# Patient Record
Sex: Female | Born: 2000 | Race: Asian | Hispanic: No | Marital: Single | Smoking: Never smoker
Health system: Southern US, Community
[De-identification: ages and names within clinical notes are randomized; demographics above are authoritative.]

## PROBLEM LIST (undated history)

## (undated) DIAGNOSIS — A039 Shigellosis, unspecified: Secondary | ICD-10-CM

## (undated) HISTORY — DX: Shigellosis, unspecified: A03.9

---
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
## 2022-06-12 ENCOUNTER — Encounter (HOSPITAL_COMMUNITY): Payer: Self-pay | Admitting: *Deleted

## 2022-06-12 ENCOUNTER — Ambulatory Visit (HOSPITAL_COMMUNITY)
Admission: EM | Admit: 2022-06-12 | Discharge: 2022-06-12 | Disposition: A | Discharge status: Home / Self Care | Payer: Medicaid Other | Attending: Internal Medicine | Admitting: Internal Medicine

## 2022-06-12 DIAGNOSIS — R0789 Other chest pain: Secondary | ICD-10-CM

## 2022-06-12 NOTE — ED Triage Notes (Signed)
Pt states she woke up this morning with chest pain that is sharp and when she uses her right arm it makes it worse. She said movement and breathing makes it hurt worse. She said she has had this before but she doesn't remember seeing anyone for it.

## 2022-06-12 NOTE — ED Provider Notes (Signed)
[facility]    CSN: [id number] Arrival date & time: 06/12/22  1349      History   Chief Complaint Chief Complaint  Patient presents with   Chest Pain    HPI Karuna Balducci is a 21 y.o. female.   The history is provided by the patient. A language interpreter was used.  Chest Pain Pain location:  L chest and R chest Pain quality: aching   Pain radiates to:  Does not radiate Pain severity:  Moderate Onset quality:  Gradual Timing:  Constant Progression:  Worsening Chronicity:  New Relieved by:  Nothing Worsened by:  Nothing Ineffective treatments:  None tried Associated symptoms: no fever and no headache   Risk factors: no smoking     Past Medical History:  Diagnosis Date   Shigella enteritis     Patient Active Problem List   Diagnosis Date Noted   Microcytic anemia 06/28/2018   Shigella enteritis     History reviewed. No pertinent surgical history.  OB History   No obstetric history on file.      Home Medications    Prior to Admission medications   Medication Sig Start Date End Date Taking? Authorizing Provider  acetaminophen (TYLENOL) 325 MG tablet Take 2 tablets (650 mg total) by mouth every 6 (six) hours as needed for mild pain or moderate pain (mild pain, fever >100.4). 06/28/18   Samule Ohm I, MD  azithromycin (ZITHROMAX) 250 MG tablet Continue to take daily until 11/21. Patient not taking: Reported on 05/28/2021 06/28/18   Samule Ohm I, MD  clotrimazole (GYNE-LOTRIMIN) 1 % vaginal cream May use topically once a day for areas of itching Patient not taking: Reported on 05/28/2021 06/01/20   Augusto Gamble B, NP  fluconazole (DIFLUCAN) 150 MG tablet Take 1 tab today. May repeat in 72 hours if symptoms have not completely resolved. Patient not taking: Reported on 05/28/2021 06/01/20   Zigmund Gottron, NP    Family History Family History  Problem Relation Age of Onset   Healthy Mother    Healthy Father     Social History Social  History   Tobacco Use   Smoking status: Never   Smokeless tobacco: Never  Vaping Use   Vaping Use: Never used  Substance Use Topics   Alcohol use: Never   Drug use: Never     Allergies   Patient has no known allergies.   Review of Systems Review of Systems  Constitutional:  Negative for fever.  Cardiovascular:  Positive for chest pain.  Neurological:  Negative for headaches.  All other systems reviewed and are negative.    Physical Exam Triage Vital Signs ED Triage Vitals  Enc Vitals Group     BP 06/12/22 1400 114/78     Pulse Rate 06/12/22 1400 81     Resp 06/12/22 1400 18     Temp 06/12/22 1400 98.5 F (36.9 C)     Temp Source 06/12/22 1400 Oral     SpO2 06/12/22 1400 99 %     Weight --      Height --      Head Circumference --      Peak Flow --      Pain Score 06/12/22 1359 4     Pain Loc --      Pain Edu? --      Excl. in [place]? --    No data found.  Updated Vital Signs BP 114/78 (BP Location: Left Arm)   Pulse 81  Temp 98.5 F (36.9 C) (Oral)   Resp 18   LMP 05/15/2022 (Exact Date)   SpO2 99%   Visual Acuity Right Eye Distance:   Left Eye Distance:   Bilateral Distance:    Right Eye Near:   Left Eye Near:    Bilateral Near:     Physical Exam Vitals and nursing note reviewed.  Constitutional:      Appearance: She is well-developed.  HENT:     Head: Normocephalic.  Cardiovascular:     Rate and Rhythm: Normal rate and regular rhythm.     Heart sounds: Normal heart sounds.  Pulmonary:     Effort: Pulmonary effort is normal.  Abdominal:     General: There is no distension.  Musculoskeletal:        General: Normal range of motion.     Cervical back: Normal range of motion.  Neurological:     General: No focal deficit present.     Mental Status: She is alert and oriented to person, place, and time.      UC Treatments / Results  Labs (all labs ordered are listed, but only abnormal results are displayed) Labs Reviewed - No data to  display  EKG   Radiology No results found.  Procedures Procedures (including critical care time)  Medications Ordered in UC Medications - No data to display  Initial Impression / Assessment and Plan / UC Course  I have reviewed the triage vital signs and the nursing notes.  Pertinent labs & imaging results that were available during my care of the patient were reviewed by me and considered in my medical decision making (see chart for details).     MDM:  Pt has pain to palpation,  Lungs and heart sound normal  vital signs are normal.  Pt advised ibuprofen.  Return if any problems.  Final Clinical Impressions(s) / UC Diagnoses   Final diagnoses:  Chest wall pain     Discharge Instructions      Ibuprofen for discomfort.  Return for recheck if symptoms persist.    ED Prescriptions   None    An After Visit Summary was printed and given to the patient.  PDMP not reviewed this encounter.   Elson Areas, [place] 06/12/22 1449

## 2022-06-12 NOTE — Discharge Instructions (Signed)
Ibuprofen for discomfort.  Return for recheck if symptoms persist.

## 2022-08-08 DIAGNOSIS — S71152A Open bite, left thigh, initial encounter: Secondary | ICD-10-CM | POA: Diagnosis not present

## 2023-03-23 ENCOUNTER — Ambulatory Visit: Payer: Medicaid Other

## 2023-04-01 ENCOUNTER — Encounter (HOSPITAL_COMMUNITY): Payer: Self-pay

## 2023-04-01 ENCOUNTER — Ambulatory Visit (HOSPITAL_COMMUNITY)
Admission: EM | Admit: 2023-04-01 | Discharge: 2023-04-01 | Disposition: A | Discharge status: Home / Self Care | Payer: Medicaid Other | Attending: Internal Medicine | Admitting: Internal Medicine

## 2023-04-01 DIAGNOSIS — N3001 Acute cystitis with hematuria: Secondary | ICD-10-CM | POA: Diagnosis not present

## 2023-04-01 LAB — POCT URINALYSIS DIP (MANUAL ENTRY)
Bilirubin, UA: NEGATIVE
Glucose, UA: NEGATIVE mg/dL
Ketones, POC UA: NEGATIVE mg/dL
Nitrite, UA: NEGATIVE
Protein Ur, POC: 100 mg/dL — AB
Spec Grav, UA: >=1.03 — AB (ref 1.010–1.025)
Urobilinogen, UA: 0.2 E.U./dL
pH, UA: 5.5 (ref 5.0–8.0)

## 2023-04-01 MED ORDER — NITROFURANTOIN MONOHYD MACRO 100 MG PO CAPS: 100.0000 mg | ORAL_CAPSULE | Freq: Two times a day (BID) | ORAL | 0 refills | Status: AC

## 2023-04-01 MED ORDER — PHENAZOPYRIDINE HCL 100 MG PO TABS: 100.0000 mg | ORAL_TABLET | Freq: Three times a day (TID) | ORAL | 0 refills | Status: DC | PRN

## 2023-04-01 NOTE — ED Triage Notes (Signed)
Pt c/o burning on urination, urgency and frequency, and bladder pressure since this am. States having sensation to urinate but can't.

## 2023-04-01 NOTE — ED Provider Notes (Signed)
[facility]    CSN: [id number] Arrival date & time: 04/01/23  1309      History   Chief Complaint Chief Complaint  Patient presents with   Urinary Tract Infection    HPI Amber Phillips is a 22 y.o. female.   Patient presents today with 1 day history of dysuria, urinary frequency and urgency, voiding smaller amounts, first noticed hematuria in urgent care today.  She denies foul urinary odor, abdominal pain, suprapubic pain or pressure, flank pain, fever, nausea/vomiting, and vaginal discharge.  She is sexually active, no concern for STIs.  Tries to urinate after sex.  Has not take anything for symptoms so far.    Past Medical History:  Diagnosis Date   Shigella enteritis     Patient Active Problem List   Diagnosis Date Noted   Microcytic anemia 06/28/2018   Shigella enteritis     History reviewed. No pertinent surgical history.  OB History   No obstetric history on file.      Home Medications    Prior to Admission medications   Medication Sig Start Date End Date Taking? Authorizing Provider  acetaminophen (TYLENOL) 325 MG tablet Take 2 tablets (650 mg total) by mouth every 6 (six) hours as needed for mild pain or moderate pain (mild pain, fever >100.4). 06/28/18   Collene Gobble I, MD  nitrofurantoin, macrocrystal-monohydrate, (MACROBID) 100 MG capsule Take 1 capsule (100 mg total) by mouth 2 (two) times daily for 5 days. 04/01/23 04/06/23 Yes Valentino Nose, NP  phenazopyridine (PYRIDIUM) 100 MG tablet Take 1 tablet (100 mg total) by mouth 3 (three) times daily as needed for pain. 04/01/23  Yes Valentino Nose, NP    Family History Family History  Problem Relation Age of Onset   Healthy Mother    Healthy Father     Social History Social History   Tobacco Use   Smoking status: Never   Smokeless tobacco: Never  Vaping Use   Vaping status: Never Used  Substance Use Topics   Alcohol use: Never   Drug use: Never     Allergies   Patient has  no known allergies.   Review of Systems Review of Systems Per HPI  Physical Exam Triage Vital Signs ED Triage Vitals  Encounter Vitals Group     BP 04/01/23 1335 124/83     Systolic BP Percentile --      Diastolic BP Percentile --      Pulse Rate 04/01/23 1335 88     Resp 04/01/23 1335 18     Temp 04/01/23 1335 98.6 F (37 C)     Temp Source 04/01/23 1335 Oral     SpO2 04/01/23 1335 99 %     Weight --      Height --      Head Circumference --      Peak Flow --      Pain Score 04/01/23 1336 6     Pain Loc --      Pain Education --      Exclude from Growth Chart --    No data found.  Updated Vital Signs BP 124/83 (BP Location: Left Arm)   Pulse 88   Temp 98.6 F (37 C) (Oral)   Resp 18   LMP 03/10/2023   SpO2 99%   Visual Acuity Right Eye Distance:   Left Eye Distance:   Bilateral Distance:    Right Eye Near:   Left Eye Near:    Bilateral  Near:     Physical Exam Vitals and nursing note reviewed.  Constitutional:      General: She is not in acute distress.    Appearance: She is not toxic-appearing.  Pulmonary:     Effort: Pulmonary effort is normal. No respiratory distress.  Abdominal:     General: Abdomen is flat. Bowel sounds are normal. There is no distension.     Palpations: Abdomen is soft. There is no mass.     Tenderness: There is no abdominal tenderness. There is no right CVA tenderness, left CVA tenderness or guarding.  Skin:    General: Skin is warm and dry.     Coloration: Skin is not jaundiced or pale.     Findings: No erythema.  Neurological:     Mental Status: She is alert and oriented to person, place, and time.     Motor: No weakness.     Gait: Gait normal.  Psychiatric:        Behavior: Behavior is cooperative.      UC Treatments / Results  Labs (all labs ordered are listed, but only abnormal results are displayed) Labs Reviewed  POCT URINALYSIS DIP (MANUAL ENTRY) - Abnormal; Notable for the following components:      Result  Value   Color, UA red (*)    Clarity, UA hazy (*)    Spec Grav, UA >=1.030 (*)    Blood, UA large (*)    Protein Ur, POC =100 (*)    Leukocytes, UA Small (1+) (*)    All other components within normal limits  URINE CULTURE    EKG   Radiology No results found.  Procedures Procedures (including critical care time)  Medications Ordered in UC Medications - No data to display  Initial Impression / Assessment and Plan / UC Course  I have reviewed the triage vital signs and the nursing notes.  Pertinent labs & imaging results that were available during my care of the patient were reviewed by me and considered in my medical decision making (see chart for details).   Patient is well-appearing, normotensive, afebrile, not tachycardic, not tachypneic, oxygenating well on room air.    1. Acute cystitis with hematuria Urinalysis today is hazy with elevated specific gravity, large amount of blood, small leukocyte esterase Urine culture is pending Will treat for UTI with Macrobid twice daily for 5 days Start Pyridium for bladder analgesia Strict ER and return precautions discussed with patient  The patient was given the opportunity to ask questions.  All questions answered to their satisfaction.  The patient is in agreement to this plan.    Final Clinical Impressions(s) / UC Diagnoses   Final diagnoses:  Acute cystitis with hematuria     Discharge Instructions      The urine sample today suggests UTI.  Take the Macrobid twice daily for 5 days as prescribed to treat it.  You can take the Pyridium every 8 hours as needed for bladder pain.  This will dye urine bright orange, so do not be alarmed.  We will contact you later this week if the urine culture shows that we need to change the antibiotic.  Please increase water intake.  If you develop fever, nausea/vomiting and are unable to keep fluids down, please go to the ER.     ED Prescriptions     Medication Sig Dispense Auth.  Provider   nitrofurantoin, macrocrystal-monohydrate, (MACROBID) 100 MG capsule Take 1 capsule (100 mg total) by mouth 2 (two) times  daily for 5 days. 10 capsule Cathlean Marseilles A, NP   phenazopyridine (PYRIDIUM) 100 MG tablet Take 1 tablet (100 mg total) by mouth 3 (three) times daily as needed for pain. 12 tablet Valentino Nose, NP      PDMP not reviewed this encounter.   Valentino Nose, NP 04/01/23 1432

## 2023-04-01 NOTE — Discharge Instructions (Signed)
The urine sample today suggests UTI.  Take the Macrobid twice daily for 5 days as prescribed to treat it.  You can take the Pyridium every 8 hours as needed for bladder pain.  This will dye urine bright orange, so do not be alarmed.  We will contact you later this week if the urine culture shows that we need to change the antibiotic.  Please increase water intake.  If you develop fever, nausea/vomiting and are unable to keep fluids down, please go to the ER.

## 2023-04-03 LAB — URINE CULTURE: Culture: 80000 — AB

## 2023-05-01 ENCOUNTER — Ambulatory Visit
Admission: EM | Admit: 2023-05-01 | Discharge: 2023-05-01 | Disposition: A | Discharge status: Home / Self Care | Payer: Medicaid Other | Attending: Internal Medicine | Admitting: Internal Medicine

## 2023-05-01 DIAGNOSIS — R350 Frequency of micturition: Secondary | ICD-10-CM | POA: Insufficient documentation

## 2023-05-01 DIAGNOSIS — N3001 Acute cystitis with hematuria: Secondary | ICD-10-CM | POA: Insufficient documentation

## 2023-05-01 DIAGNOSIS — R300 Dysuria: Secondary | ICD-10-CM | POA: Diagnosis not present

## 2023-05-01 LAB — POCT URINALYSIS DIP (MANUAL ENTRY)
Bilirubin, UA: NEGATIVE
Glucose, UA: NEGATIVE mg/dL
Ketones, POC UA: NEGATIVE mg/dL
Nitrite, UA: NEGATIVE
Protein Ur, POC: NEGATIVE mg/dL
Spec Grav, UA: 1.02 (ref 1.010–1.025)
Urobilinogen, UA: 0.2 E.U./dL
pH, UA: 7 (ref 5.0–8.0)

## 2023-05-01 MED ORDER — CIPROFLOXACIN HCL 500 MG PO TABS: 500.0000 mg | ORAL_TABLET | Freq: Two times a day (BID) | ORAL | 0 refills | Status: DC

## 2023-05-01 NOTE — ED Provider Notes (Signed)
[facility]    CSN: [id number] Arrival date & time: 05/01/23  1102      History   Chief Complaint Chief Complaint  Patient presents with   Dysuria    HPI Amber Phillips is a 22 y.o. female.   Patient presents with a 2 to 3-day history of urinary burning, urinary frequency, bladder pressure.  Patient had a UTI approximately 1 month ago and was treated with success.  States that she does not typically get UTIs this often but has had them in the past.  Last menstrual cycle was 04/01/2023.  Patient is not reporting any fever, back pain, abdominal pain, vaginal discharge.   Dysuria   Past Medical History:  Diagnosis Date   Shigella enteritis     Patient Active Problem List   Diagnosis Date Noted   Microcytic anemia 06/28/2018   Shigella enteritis     History reviewed. No pertinent surgical history.  OB History   No obstetric history on file.      Home Medications    Prior to Admission medications   Medication Sig Start Date End Date Taking? Authorizing Provider  ciprofloxacin (CIPRO) 500 MG tablet Take 1 tablet (500 mg total) by mouth every 12 (twelve) hours. 05/01/23  Yes Anilah Huck, Acie Fredrickson, FNP  acetaminophen (TYLENOL) 325 MG tablet Take 2 tablets (650 mg total) by mouth every 6 (six) hours as needed for mild pain or moderate pain (mild pain, fever >100.4). 06/28/18   Collene Gobble I, MD  phenazopyridine (PYRIDIUM) 100 MG tablet Take 1 tablet (100 mg total) by mouth 3 (three) times daily as needed for pain. 04/01/23   Valentino Nose, NP    Family History Family History  Problem Relation Age of Onset   Healthy Mother    Healthy Father     Social History Social History   Tobacco Use   Smoking status: Never   Smokeless tobacco: Never  Vaping Use   Vaping status: Never Used  Substance Use Topics   Alcohol use: Never   Drug use: Never     Allergies   Patient has no known allergies.   Review of Systems Review of Systems Per HPI  Physical  Exam Triage Vital Signs ED Triage Vitals  Encounter Vitals Group     BP 05/01/23 1214 112/74     Systolic BP Percentile --      Diastolic BP Percentile --      Pulse Rate 05/01/23 1214 83     Resp 05/01/23 1214 16     Temp 05/01/23 1214 97.9 F (36.6 C)     Temp Source 05/01/23 1214 Oral     SpO2 05/01/23 1214 98 %     Weight 05/01/23 1213 140 lb (63.5 kg)     Height 05/01/23 1213 5\' 2"  (1.575 m)     Head Circumference --      Peak Flow --      Pain Score 05/01/23 1213 9     Pain Loc --      Pain Education --      Exclude from Growth Chart --    No data found.  Updated Vital Signs BP 112/74 (BP Location: Left Arm)   Pulse 83   Temp 97.9 F (36.6 C) (Oral)   Resp 16   Ht 5\' 2"  (1.575 m)   Wt 140 lb (63.5 kg)   LMP 04/11/2023 (Exact Date)   SpO2 98%   BMI 25.61 kg/m   Visual Acuity Right Eye  Distance:   Left Eye Distance:   Bilateral Distance:    Right Eye Near:   Left Eye Near:    Bilateral Near:     Physical Exam Constitutional:      General: She is not in acute distress.    Appearance: Normal appearance. She is not toxic-appearing or diaphoretic.  HENT:     Head: Normocephalic and atraumatic.  Eyes:     Extraocular Movements: Extraocular movements intact.     Conjunctiva/sclera: Conjunctivae normal.  Pulmonary:     Effort: Pulmonary effort is normal.  Neurological:     General: No focal deficit present.     Mental Status: She is alert and oriented to person, place, and time. Mental status is at baseline.  Psychiatric:        Mood and Affect: Mood normal.        Behavior: Behavior normal.        Thought Content: Thought content normal.        Judgment: Judgment normal.      UC Treatments / Results  Labs (all labs ordered are listed, but only abnormal results are displayed) Labs Reviewed  POCT URINALYSIS DIP (MANUAL ENTRY) - Abnormal; Notable for the following components:      Result Value   Clarity, UA cloudy (*)    Blood, UA trace-lysed (*)     Leukocytes, UA Small (1+) (*)    All other components within normal limits  URINE CULTURE    EKG   Radiology No results found.  Procedures Procedures (including critical care time)  Medications Ordered in UC Medications - No data to display  Initial Impression / Assessment and Plan / UC Course  I have reviewed the triage vital signs and the nursing notes.  Pertinent labs & imaging results that were available during my care of the patient were reviewed by me and considered in my medical decision making (see chart for details).     UA resembling UTI.  Will treat with Cipro. Patient denies that she takes any daily medications or has any chronic health problems so this should be safe.   Urine culture pending. Advised preventative measures for UTI.  Advised strict follow-up precautions.  Patient verbalized understanding and was agreeable with plan. Final Clinical Impressions(s) / UC Diagnoses   Final diagnoses:  Acute cystitis with hematuria  Dysuria  Urinary frequency   Discharge Instructions   None    ED Prescriptions     Medication Sig Dispense Auth. Provider   ciprofloxacin (CIPRO) 500 MG tablet Take 1 tablet (500 mg total) by mouth every 12 (twelve) hours. 10 tablet Gustavus Bryant, [place]      PDMP not reviewed this encounter.   Gustavus Bryant, [place] 05/01/23 [phone]

## 2023-05-01 NOTE — Discharge Instructions (Signed)
I have sent an antibiotic to treat UTI.  Follow-up if any symptoms persist or worsen.

## 2023-05-01 NOTE — ED Triage Notes (Signed)
Patient here today with c/o burning in urination, frequency, and pressure X 2-3 days. Patient was here on 04/01/2023 with same issue. Patient states that she completed all the medication and the symptoms went away but the came back.

## 2023-05-03 LAB — URINE CULTURE: Culture: >=100000 — AB

## 2024-05-04 ENCOUNTER — Encounter: Payer: Self-pay | Admitting: Emergency Medicine

## 2024-05-04 ENCOUNTER — Ambulatory Visit
Admission: EM | Admit: 2024-05-04 | Discharge: 2024-05-04 | Disposition: A | Discharge status: Home / Self Care | Attending: Nurse Practitioner | Admitting: Nurse Practitioner

## 2024-05-04 DIAGNOSIS — N760 Acute vaginitis: Secondary | ICD-10-CM | POA: Insufficient documentation

## 2024-05-04 LAB — POCT URINE PREGNANCY: Preg Test, Ur: NEGATIVE

## 2024-05-04 MED ORDER — FLUCONAZOLE 150 MG PO TABS: 150.0000 mg | ORAL_TABLET | ORAL | 0 refills | Status: DC

## 2024-05-04 MED ORDER — METRONIDAZOLE 500 MG PO TABS: 500.0000 mg | ORAL_TABLET | Freq: Two times a day (BID) | ORAL | 0 refills | Status: DC

## 2024-05-04 NOTE — ED Provider Notes (Signed)
 [facility]    CSN: [id number] Arrival date & time: 05/04/24  1001      History   Chief Complaint Chief Complaint  Patient presents with   Possible yeast infection     HPI Amber Phillips is a 23 y.o. female.   Discussed the use of AI scribe software for clinical note transcription with the patient, who gave verbal consent to proceed.   The patient is a sexually active female presenting with symptoms of a yeast infection that has been ongoing for more than 4 days. She reports experiencing itching and irritation in the vaginal area, with pain when wiping that is described as very painful. She also notes that the area feels swollen at times. The patient is experiencing vaginal discharge that she describes as clumpy, like cottage cheese, with a slight odor that is not too bad. She denies pain or burning with urination, stating that discomfort only occurs when wiping afterward. Her last menstrual period was August 20-24. She has one female sexual partner in the past 3 months and uses condoms sometimes.   The following sections of the patient's history were reviewed and updated as appropriate: allergies, current medications, past family history, past medical history, past social history, past surgical history, and problem list.      Past Medical History:  Diagnosis Date   Shigella enteritis     Patient Active Problem List   Diagnosis Date Noted   Microcytic anemia 06/28/2018   Shigella enteritis     History reviewed. No pertinent surgical history.  OB History   No obstetric history on file.      Home Medications    Prior to Admission medications   Medication Sig Start Date End Date Taking? Authorizing Provider  fluconazole  (DIFLUCAN ) 150 MG tablet Take 1 tablet (150 mg total) by mouth every 3 (three) days for 2 doses. 05/04/24 05/08/24 Yes Iola Lukes, FNP  metroNIDAZOLE  (FLAGYL ) 500 MG tablet Take 1 tablet (500 mg total) by mouth 2 (two) times daily. 05/04/24   Yes Iola Lukes, FNP    Family History Family History  Problem Relation Age of Onset   Healthy Mother    Healthy Father     Social History Social History   Tobacco Use   Smoking status: Never    Passive exposure: Current   Smokeless tobacco: Never  Vaping Use   Vaping status: Never Used  Substance Use Topics   Alcohol use: Never   Drug use: Never     Allergies   Patient has no known allergies.   Review of Systems Review of Systems  Genitourinary:  Positive for vaginal discharge (clumpy like cottage-cheese). Negative for dysuria and menstrual problem (LMP: 03/31/23).       Vaginal itching and irritation. Mild odor.   All other systems reviewed and are negative.    Physical Exam Triage Vital Signs ED Triage Vitals  Encounter Vitals Group     BP 05/04/24 1228 114/77     Girls Systolic BP Percentile --      Girls Diastolic BP Percentile --      Boys Systolic BP Percentile --      Boys Diastolic BP Percentile --      Pulse Rate 05/04/24 1228 85     Resp 05/04/24 1228 16     Temp 05/04/24 1228 99 F (37.2 C)     Temp Source 05/04/24 1228 Oral     SpO2 05/04/24 1228 99 %     Weight 05/04/24 1226  150 lb (68 kg)     Height --      Head Circumference --      Peak Flow --      Pain Score 05/04/24 1223 7     Pain Loc --      Pain Education --      Exclude from Growth Chart --    No data found.  Updated Vital Signs BP 114/77 (BP Location: Left Arm)   Pulse 85   Temp 99 F (37.2 C) (Oral)   Resp 16   Wt 150 lb (68 kg)   LMP 04/03/2024 (Exact Date)   SpO2 99%   BMI 27.44 kg/m   Visual Acuity Right Eye Distance:   Left Eye Distance:   Bilateral Distance:    Right Eye Near:   Left Eye Near:    Bilateral Near:     Physical Exam Constitutional:      General: She is not in acute distress.    Appearance: Normal appearance. She is not ill-appearing, toxic-appearing or diaphoretic.  HENT:     Head: Normocephalic.     Nose: Nose normal.      Mouth/Throat:     Mouth: Mucous membranes are moist.  Eyes:     Conjunctiva/sclera: Conjunctivae normal.  Cardiovascular:     Rate and Rhythm: Normal rate.  Pulmonary:     Effort: Pulmonary effort is normal.  Abdominal:     Palpations: Abdomen is soft.  Genitourinary:    Comments: Deferred; patient performed self-swab for Aptima testing  Musculoskeletal:        General: Normal range of motion.     Cervical back: Normal range of motion and neck supple.  Skin:    General: Skin is warm and dry.  Neurological:     General: No focal deficit present.     Mental Status: She is alert and oriented to person, place, and time.  Psychiatric:        Mood and Affect: Mood normal.        Behavior: Behavior normal.      UC Treatments / Results  Labs (all labs ordered are listed, but only abnormal results are displayed) Labs Reviewed  POCT URINE PREGNANCY - Normal  CERVICOVAGINAL ANCILLARY ONLY    EKG   Radiology No results found.  Procedures Procedures (including critical care time)  Medications Ordered in UC Medications - No data to display  Initial Impression / Assessment and Plan / UC Course  I have reviewed the triage vital signs and the nursing notes.  Pertinent labs & imaging results that were available during my care of the patient were reviewed by me and considered in my medical decision making (see chart for details).     The patient was evaluated today for vaginal discharge. Testing was obtained for bacterial vaginosis, yeast, gonorrhea, chlamydia, and trichomonas. While awaiting results, empiric treatment was initiated for the most likely non-sexually transmitted causes, including bacterial vaginosis and yeast infection, given the patient's symptoms. Patient education was provided on avoiding douching, vaginal sprays, or deodorants, and wearing cotton-lined underwear to reduce moisture and improve airflow. She was advised to stay hydrated and use barrier protection if  sexually active to reduce the risk of recurrent infection. The patient was informed that she will only be contacted if test results are positive, and results will also be available for review on her MyChart account. She was advised to follow up with her primary care provider or gynecologist if symptoms do not improve  with treatment, and to seek care sooner if she develops fever, pelvic pain, or worsening discharge.   Today's evaluation has revealed no signs of a dangerous process. Discussed diagnosis with patient and/or guardian. Patient and/or guardian aware of their diagnosis, possible red flag symptoms to watch out for and need for close follow up. Patient and/or guardian understands verbal and written discharge instructions. Patient and/or guardian comfortable with plan and disposition.  Patient and/or guardian has a clear mental status at this time, good insight into illness (after discussion and teaching) and has clear judgment to make decisions regarding their care  Documentation was completed with the aid of voice recognition software. Transcription may contain typographical errors.  Final Clinical Impressions(s) / UC Diagnoses   Final diagnoses:  Vaginitis and vulvovaginitis     Discharge Instructions      You were seen today for vaginal discharge, which can occur when the normal balance of bacteria in the vagina changes. This can be influenced by various factors, including new or multiple sexual partners, unprotected sex, douching, smoking, certain antibiotics, or pregnancy. It's also possible to develop a vaginal infection even without being sexually active. Tests were performed today to check for bacteria, yeast, gonorrhea, chlamydia, and trichomonas. While results are pending, treatment has been initiated for the most common non-STD causes--bacterial vaginosis and yeast infection--based on your symptoms. It is important that you avoid any sexual activity until your test results have  returned, your treatment is complete, and your symptoms have fully resolved. You will only be contacted if any of your test results are positive. You can also review your results through your MyChart account. During this time, avoid douching or using vaginal sprays or deodorants. Wear cotton or cotton-lined underwear to improve airflow and reduce moisture. Be sure to stay hydrated by drinking plenty of fluids. Please note that while you are taking the prescribed Flagyl  (metronidazole ) it is very important to avoid drinking alcohol while on this medication. Drinking alcohol can cause severe side effects, such as nausea, vomiting, and headaches.  See your regular doctor or gynecologist if your symptoms do not start to improve with treatment. Go to the emergency room right away if you develop a fever, new or worsening pelvic pain, or if the vaginal discharge gets worse.     ED Prescriptions     Medication Sig Dispense Auth. Provider   metroNIDAZOLE  (FLAGYL ) 500 MG tablet Take 1 tablet (500 mg total) by mouth 2 (two) times daily. 14 tablet Precious Gilchrest, Margaret, FNP   fluconazole  (DIFLUCAN ) 150 MG tablet Take 1 tablet (150 mg total) by mouth every 3 (three) days for 2 doses. 2 tablet Iola Lukes, FNP      PDMP not reviewed this encounter.   Iola Lukes, [place] 05/04/24 1350

## 2024-05-04 NOTE — Discharge Instructions (Addendum)
 You were seen today for vaginal discharge, which can occur when the normal balance of bacteria in the vagina changes. This can be influenced by various factors, including new or multiple sexual partners, unprotected sex, douching, smoking, certain antibiotics, or pregnancy. It's also possible to develop a vaginal infection even without being sexually active. Tests were performed today to check for bacteria, yeast, gonorrhea, chlamydia, and trichomonas. While results are pending, treatment has been initiated for the most common non-STD causes--bacterial vaginosis and yeast infection--based on your symptoms. It is important that you avoid any sexual activity until your test results have returned, your treatment is complete, and your symptoms have fully resolved. You will only be contacted if any of your test results are positive. You can also review your results through your MyChart account. During this time, avoid douching or using vaginal sprays or deodorants. Wear cotton or cotton-lined underwear to improve airflow and reduce moisture. Be sure to stay hydrated by drinking plenty of fluids. Please note that while you are taking the prescribed Flagyl  (metronidazole ) it is very important to avoid drinking alcohol while on this medication. Drinking alcohol can cause severe side effects, such as nausea, vomiting, and headaches.  See your regular doctor or gynecologist if your symptoms do not start to improve with treatment. Go to the emergency room right away if you develop a fever, new or worsening pelvic pain, or if the vaginal discharge gets worse.

## 2024-05-04 NOTE — ED Triage Notes (Signed)
 Pt presents c/o yeast infection sxs x 4 days. Pt reports she is feeling a little pain when she wipes and the area is very itchy. Pt also reports thick and clumpy discharge. Pt denies any additional sxs.

## 2024-05-05 ENCOUNTER — Encounter (HOSPITAL_COMMUNITY): Payer: Self-pay

## 2024-05-05 ENCOUNTER — Ambulatory Visit (HOSPITAL_COMMUNITY)
Admission: EM | Admit: 2024-05-05 | Discharge: 2024-05-05 | Disposition: A | Discharge status: Home / Self Care | Attending: Emergency Medicine | Admitting: Emergency Medicine

## 2024-05-05 ENCOUNTER — Ambulatory Visit (HOSPITAL_COMMUNITY): Payer: Self-pay

## 2024-05-05 DIAGNOSIS — R2242 Localized swelling, mass and lump, left lower limb: Secondary | ICD-10-CM | POA: Insufficient documentation

## 2024-05-05 LAB — CERVICOVAGINAL ANCILLARY ONLY
Bacterial Vaginitis (gardnerella): NEGATIVE
Candida Glabrata: NEGATIVE
Candida Vaginitis: POSITIVE — AB
Chlamydia: NEGATIVE
Comment: NEGATIVE
Comment: NEGATIVE
Comment: NEGATIVE
Comment: NEGATIVE
Comment: NEGATIVE
Comment: NORMAL
Neisseria Gonorrhea: NEGATIVE
Trichomonas: NEGATIVE

## 2024-05-05 LAB — CBC WITH DIFFERENTIAL/PLATELET
Basophils Absolute: 0.2 K/uL — ABNORMAL HIGH (ref 0.0–0.1)
Basophils Relative: 2 %
Eosinophils Absolute: 0.4 K/uL (ref 0.0–0.5)
Eosinophils Relative: 5 %
HCT: 33.1 % — ABNORMAL LOW (ref 36.0–46.0)
Hemoglobin: 11 g/dL — ABNORMAL LOW (ref 12.0–15.0)
Lymphocytes Relative: 28 %
Lymphs Abs: 2.5 K/uL (ref 0.7–4.0)
MCH: 20.3 pg — ABNORMAL LOW (ref 26.0–34.0)
MCHC: 33.2 g/dL (ref 30.0–36.0)
MCV: 61 fL — ABNORMAL LOW (ref 80.0–100.0)
Monocytes Absolute: 0.5 K/uL (ref 0.1–1.0)
Monocytes Relative: 6 %
Neutro Abs: 5.3 K/uL (ref 1.7–7.7)
Neutrophils Relative %: 59 %
Platelets: 261 K/uL (ref 150–400)
RBC: 5.43 MIL/uL — ABNORMAL HIGH (ref 3.87–5.11)
RDW: 14.8 % (ref 11.5–15.5)
Smear Review: NORMAL
WBC: 8.9 K/uL (ref 4.0–10.5)
nRBC: 0 % (ref 0.0–0.2)

## 2024-05-05 LAB — COMPREHENSIVE METABOLIC PANEL WITH GFR
ALT: 13 U/L (ref 0–44)
AST: 19 U/L (ref 15–41)
Albumin: 4 g/dL (ref 3.5–5.0)
Alkaline Phosphatase: 37 U/L — ABNORMAL LOW (ref 38–126)
Anion gap: 11 (ref 5–15)
BUN: 11 mg/dL (ref 6–20)
CO2: 22 mmol/L (ref 22–32)
Calcium: 8.9 mg/dL (ref 8.9–10.3)
Chloride: 104 mmol/L (ref 98–111)
Creatinine, Ser: 0.64 mg/dL (ref 0.44–1.00)
GFR, Estimated: >60 mL/min (ref >60)
Glucose, Bld: 64 mg/dL — ABNORMAL LOW (ref 70–99)
Potassium: 3.8 mmol/L (ref 3.5–5.1)
Sodium: 137 mmol/L (ref 135–145)
Total Bilirubin: 0.8 mg/dL (ref 0.0–1.2)
Total Protein: 7.8 g/dL (ref 6.5–8.1)

## 2024-05-05 MED ORDER — FLUCONAZOLE 150 MG PO TABS: ORAL_TABLET | ORAL | 0 refills | Status: AC

## 2024-05-05 MED ORDER — DOXYCYCLINE HYCLATE 100 MG PO TABS: 100.0000 mg | ORAL_TABLET | Freq: Two times a day (BID) | ORAL | 0 refills | Status: AC

## 2024-05-05 NOTE — ED Triage Notes (Signed)
 Presenting with pain and a hard ball in the upper/inner left thigh to groin area. Onset 2 weeks ago. Started off as a throbbing pain with walking but now worse and can feel a hard mass.   No known falls or injuries.

## 2024-05-05 NOTE — Discharge Instructions (Addendum)
 Your vaginal swab was positive for yeast vaginitis, this is what the Diflucan  treats.  You can stop taking the Diflucan  as you do not have bacterial vaginosis.  I suspect the painful lump is a pocket of infection.  Take the doxycycline  twice daily with food for the next 7 days.  Take a Diflucan  on day 3 and then the last day of antibiotics to help prevent getting another vaginal yeast infection.  We are checking some basic labs and we will contact you if follow-up is needed.  Your primary care provider is [facility], please schedule follow-up appointment with them to recheck the spot on your leg.

## 2024-05-05 NOTE — ED Provider Notes (Signed)
 [facility]    CSN: [id number] Arrival date & time: 05/05/24  1347      History   Chief Complaint Chief Complaint  Patient presents with   Mass   Leg Pain    HPI Amber Phillips is a 23 y.o. female.   Patient presents to clinic over concern of a hard lump to the left upper inner thigh near the groin area.  Thinks it started around 2 weeks ago but the area has gotten significantly more painful recently so she decided to return to clinic.  Has not had any fevers.  Denies drainage from the area.  Has been taking Diflucan  for yeast vaginitis.  Has continued to take the Flagyl  for concern of bacterial vaginosis, recent swab was negative.  The history is provided by the patient and medical records.  Leg Pain   Past Medical History:  Diagnosis Date   Shigella enteritis     Patient Active Problem List   Diagnosis Date Noted   Microcytic anemia 06/28/2018   Shigella enteritis     History reviewed. No pertinent surgical history.  OB History   No obstetric history on file.      Home Medications    Prior to Admission medications   Medication Sig Start Date End Date Taking? Authorizing Provider  doxycycline  (VIBRA -TABS) 100 MG tablet Take 1 tablet (100 mg total) by mouth 2 (two) times daily for 7 days. 05/05/24 05/12/24 Yes Eivan Gallina  N, FNP  fluconazole  (DIFLUCAN ) 150 MG tablet Take 1 tablet on day 3 of antibiotics and 1 tablet on the last day of antibiotics. 05/05/24  Yes Dreama, Jackalynn Art  N, FNP    Family History Family History  Problem Relation Age of Onset   Healthy Mother    Healthy Father     Social History Social History   Tobacco Use   Smoking status: Never    Passive exposure: Current   Smokeless tobacco: Never  Vaping Use   Vaping status: Never Used  Substance Use Topics   Alcohol use: Never   Drug use: Never     Allergies   Patient has no known allergies.   Review of Systems Review of Systems  Per HPI  Physical  Exam Triage Vital Signs ED Triage Vitals  Encounter Vitals Group     BP 05/05/24 1445 102/73     Girls Systolic BP Percentile --      Girls Diastolic BP Percentile --      Boys Systolic BP Percentile --      Boys Diastolic BP Percentile --      Pulse Rate 05/05/24 1445 71     Resp 05/05/24 1445 16     Temp 05/05/24 1445 98.7 F (37.1 C)     Temp Source 05/05/24 1445 Oral     SpO2 05/05/24 1445 98 %     Weight 05/05/24 1445 150 lb (68 kg)     Height 05/05/24 1445 5' 2 (1.575 m)     Head Circumference --      Peak Flow --      Pain Score 05/05/24 1443 9     Pain Loc --      Pain Education --      Exclude from Growth Chart --    No data found.  Updated Vital Signs BP 102/73 (BP Location: Left Arm)   Pulse 71   Temp 98.7 F (37.1 C) (Oral)   Resp 16   Ht 5' 2 (1.575 m)   Wt  150 lb (68 kg)   LMP 03/30/2024 (Exact Date)   SpO2 98%   BMI 27.44 kg/m   Visual Acuity Right Eye Distance:   Left Eye Distance:   Bilateral Distance:    Right Eye Near:   Left Eye Near:    Bilateral Near:     Physical Exam Vitals and nursing note reviewed.  Constitutional:      Appearance: Normal appearance.  HENT:     Head: Normocephalic and atraumatic.     Right Ear: External ear normal.     Left Ear: External ear normal.     Nose: Nose normal.     Mouth/Throat:     Mouth: Mucous membranes are moist.  Eyes:     Conjunctiva/sclera: Conjunctivae normal.  Cardiovascular:     Rate and Rhythm: Normal rate.  Pulmonary:     Effort: Pulmonary effort is normal. No respiratory distress.  Skin:    General: Skin is warm and dry.      Neurological:     General: No focal deficit present.     Mental Status: She is alert and oriented to person, place, and time.  Psychiatric:        Mood and Affect: Mood normal.        Behavior: Behavior normal.      UC Treatments / Results  Labs (all labs ordered are listed, but only abnormal results are displayed) Labs Reviewed  CBC WITH  DIFFERENTIAL/PLATELET  COMPREHENSIVE METABOLIC PANEL WITH GFR    EKG   Radiology No results found.  Procedures Procedures (including critical care time)  Medications Ordered in UC Medications - No data to display  Initial Impression / Assessment and Plan / UC Course  I have reviewed the triage vital signs and the nursing notes.  Pertinent labs & imaging results that were available during my care of the patient were reviewed by me and considered in my medical decision making (see chart for details).  Vitals and triage reviewed, patient is hemodynamically stable.  Palpable indurated area to the left inguinal area, somewhat below inguinal lymph nodes.  Without lymphadenopathy.  Will check CBC and CMP in clinic.  Doxycycline  started as this appears to be bacterial ideology.  Will resend Diflucan  to help prevent further yeast vaginitis.  PCP urgent care follow-up if symptoms persist.  Plan of care, follow-up care return precautions given, no questions at this time.    Final Clinical Impressions(s) / UC Diagnoses   Final diagnoses:  Skin lump of leg, left     Discharge Instructions      Your vaginal swab was positive for yeast vaginitis, this is what the Diflucan  treats.  You can stop taking the Diflucan  as you do not have bacterial vaginosis.  I suspect the painful lump is a pocket of infection.  Take the doxycycline  twice daily with food for the next 7 days.  Take a Diflucan  on day 3 and then the last day of antibiotics to help prevent getting another vaginal yeast infection.  We are checking some basic labs and we will contact you if follow-up is needed.  Your primary care provider is [facility], please schedule follow-up appointment with them to recheck the spot on your leg.     ED Prescriptions     Medication Sig Dispense Auth. Provider   fluconazole  (DIFLUCAN ) 150 MG tablet Take 1 tablet on day 3 of antibiotics and 1 tablet on the last  day of antibiotics. 2 tablet Dreama,  Symphani Eckstrom  N, FNP   doxycycline  (VIBRA -TABS) 100 MG tablet Take 1 tablet (100 mg total) by mouth 2 (two) times daily for 7 days. 14 tablet Dreama, Lache Dagher  N, FNP      PDMP not reviewed this encounter.   Dreama Emaline SAILOR, [place] 05/05/24 [phone]

## 2024-05-06 ENCOUNTER — Ambulatory Visit (HOSPITAL_COMMUNITY): Payer: Self-pay

## 2024-05-06 NOTE — Telephone Encounter (Signed)
 Anemia is improved from previous.  No concern for active infection.  Metabolic panel is essentially normal.

## 2024-08-16 ENCOUNTER — Ambulatory Visit (HOSPITAL_BASED_OUTPATIENT_CLINIC_OR_DEPARTMENT_OTHER): Admitting: Obstetrics and Gynecology

## 2024-08-16 ENCOUNTER — Other Ambulatory Visit (HOSPITAL_COMMUNITY)
Admission: RE | Admit: 2024-08-16 | Discharge: 2024-08-16 | Disposition: A | Source: Ambulatory Visit | Attending: Obstetrics and Gynecology | Admitting: Obstetrics and Gynecology

## 2024-08-16 ENCOUNTER — Encounter (HOSPITAL_BASED_OUTPATIENT_CLINIC_OR_DEPARTMENT_OTHER): Payer: Self-pay | Admitting: Obstetrics and Gynecology

## 2024-08-16 VITALS — BP 109/78 | HR 84 | Ht 62.5 in | Wt 135.6 lb

## 2024-08-16 DIAGNOSIS — R6881 Early satiety: Secondary | ICD-10-CM

## 2024-08-16 DIAGNOSIS — Z1331 Encounter for screening for depression: Secondary | ICD-10-CM | POA: Diagnosis not present

## 2024-08-16 DIAGNOSIS — Z758 Other problems related to medical facilities and other health care: Secondary | ICD-10-CM | POA: Diagnosis not present

## 2024-08-16 DIAGNOSIS — R140 Abdominal distension (gaseous): Secondary | ICD-10-CM

## 2024-08-16 DIAGNOSIS — Z124 Encounter for screening for malignant neoplasm of cervix: Secondary | ICD-10-CM | POA: Insufficient documentation

## 2024-08-16 DIAGNOSIS — Z3009 Encounter for other general counseling and advice on contraception: Secondary | ICD-10-CM | POA: Diagnosis not present

## 2024-08-16 NOTE — Patient Instructions (Signed)
[email or web address]

## 2024-08-16 NOTE — Progress Notes (Unsigned)
" ° °  GYNECOLOGY PROGRESS NOTE  History:  24 y.o. G0P0000 presents to [facility]  Eats one meal a day feels bloated with meal. Almost always been this way but getting worse.  Denies nausea and heartburn   The following portions of the patient's history were reviewed and updated as appropriate: allergies, current medications, past family history, past medical history, past social history, past surgical history and problem list. Last pap smear on *** was normal, *** HRHPV.  Health Maintenance Due  Topic Date Due   HPV VACCINES (1 - 3-dose series) Never done   Meningococcal B Vaccine (1 of 2 - Standard) Never done   Hepatitis C Screening  Never done   Hepatitis B Vaccines 19-59 Average Risk (1 of 3 - 19+ 3-dose series) Never done   Cervical Cancer Screening (Pap smear)  Never done   Influenza Vaccine  03/11/2024   COVID-19 Vaccine (1 - 2025-26 season) Never done     Review of Systems:  Pertinent items are noted in HPI.   Objective:  Physical Exam Blood pressure 109/78, pulse 84, height 5' 2.5 (1.588 m), weight 135 lb 9.6 oz (61.5 kg), last menstrual period 07/14/2024. VS reviewed, nursing note reviewed,  Constitutional: well developed, well nourished, no distress HEENT: normocephalic CV: normal rate Pulm/chest wall: normal effort Breast Exam: deferred Abdomen: soft Neuro: alert and oriented x 3 Skin: warm, dry Psych: affect normal Pelvic exam: Cervix pink, visually closed, without lesion, scant white creamy discharge, vaginal walls and external genitalia normal Bimanual exam: Cervix 0/long/high, firm, anterior, neg CMT, uterus nontender, nonenlarged, adnexa without tenderness, enlargement, or mass  Assessment & Plan:  There are no diagnoses linked to this encounter.  No follow-ups on file.    Nidia Daring, FNP 3:57 PM "

## 2024-08-18 LAB — CYTOLOGY - PAP: Diagnosis: NEGATIVE

## 2024-08-19 ENCOUNTER — Ambulatory Visit: Payer: Self-pay | Admitting: Obstetrics and Gynecology

## 2024-09-06 ENCOUNTER — Ambulatory Visit (HOSPITAL_BASED_OUTPATIENT_CLINIC_OR_DEPARTMENT_OTHER)
